# Patient Record
Sex: Female | Born: 1980 | Race: Black or African American | Hispanic: No | Marital: Single | Smoking: Never smoker
Health system: Southern US, Community
[De-identification: ages and names within clinical notes are randomized; demographics above are authoritative.]

## PROBLEM LIST (undated history)

## (undated) DIAGNOSIS — F419 Anxiety disorder, unspecified: Secondary | ICD-10-CM

## (undated) DIAGNOSIS — K219 Gastro-esophageal reflux disease without esophagitis: Secondary | ICD-10-CM

---

## 2020-03-05 ENCOUNTER — Encounter: Payer: Self-pay | Admitting: Emergency Medicine

## 2020-03-05 ENCOUNTER — Emergency Department: Payer: BC Managed Care – PPO

## 2020-03-05 ENCOUNTER — Emergency Department
Admission: EM | Admit: 2020-03-05 | Discharge: 2020-03-05 | Disposition: A | Discharge status: Home / Self Care | Payer: BC Managed Care – PPO | Attending: Emergency Medicine | Admitting: Emergency Medicine

## 2020-03-05 ENCOUNTER — Other Ambulatory Visit: Payer: Self-pay

## 2020-03-05 DIAGNOSIS — R0789 Other chest pain: Secondary | ICD-10-CM | POA: Insufficient documentation

## 2020-03-05 DIAGNOSIS — K219 Gastro-esophageal reflux disease without esophagitis: Secondary | ICD-10-CM | POA: Insufficient documentation

## 2020-03-05 DIAGNOSIS — F419 Anxiety disorder, unspecified: Secondary | ICD-10-CM | POA: Insufficient documentation

## 2020-03-05 HISTORY — DX: Gastro-esophageal reflux disease without esophagitis: K21.9

## 2020-03-05 HISTORY — DX: Anxiety disorder, unspecified: F41.9

## 2020-03-05 LAB — BASIC METABOLIC PANEL
Anion gap: 7 (ref 5–15)
BUN: 8 mg/dL (ref 6–20)
CO2: 24 mmol/L (ref 22–32)
Calcium: 8.9 mg/dL (ref 8.9–10.3)
Chloride: 106 mmol/L (ref 98–111)
Creatinine, Ser: 0.87 mg/dL (ref 0.44–1.00)
GFR calc Af Amer: >60 mL/min (ref >60)
GFR calc non Af Amer: >60 mL/min (ref >60)
Glucose, Bld: 111 mg/dL — ABNORMAL HIGH (ref 70–99)
Potassium: 3.8 mmol/L (ref 3.5–5.1)
Sodium: 137 mmol/L (ref 135–145)

## 2020-03-05 LAB — CBC
HCT: 30.2 % — ABNORMAL LOW (ref 36.0–46.0)
Hemoglobin: 9 g/dL — ABNORMAL LOW (ref 12.0–15.0)
MCH: 23.3 pg — ABNORMAL LOW (ref 26.0–34.0)
MCHC: 29.8 g/dL — ABNORMAL LOW (ref 30.0–36.0)
MCV: 78 fL — ABNORMAL LOW (ref 80.0–100.0)
Platelets: 496 10*3/uL — ABNORMAL HIGH (ref 150–400)
RBC: 3.87 MIL/uL (ref 3.87–5.11)
RDW: 18.5 % — ABNORMAL HIGH (ref 11.5–15.5)
WBC: 6.1 10*3/uL (ref 4.0–10.5)
nRBC: 0 % (ref 0.0–0.2)

## 2020-03-05 LAB — TROPONIN I (HIGH SENSITIVITY): Troponin I (High Sensitivity): 3 ng/L (ref <18)

## 2020-03-05 MED ORDER — ALUM & MAG HYDROXIDE-SIMETH 200-200-20 MG/5ML PO SUSP
30.0000 mL | Freq: Once | ORAL | Status: AC
  Administered 2020-03-05: 11:00:00 30 mL via ORAL
  Filled 2020-03-05: qty 30

## 2020-03-05 MED ORDER — LORAZEPAM 1 MG PO TABS
1.0000 mg | ORAL_TABLET | Freq: Once | ORAL | Status: AC
  Administered 2020-03-05: 11:00:00 1 mg via ORAL
  Filled 2020-03-05: qty 1

## 2020-03-05 MED ORDER — HYDROXYZINE HCL 25 MG PO TABS: 25.0000 mg | ORAL_TABLET | Freq: Three times a day (TID) | ORAL | 0 refills | Status: AC | PRN

## 2020-03-05 MED ORDER — FAMOTIDINE 20 MG PO TABS: 20.0000 mg | ORAL_TABLET | Freq: Two times a day (BID) | ORAL | 0 refills | Status: AC

## 2020-03-05 MED ORDER — FAMOTIDINE 20 MG PO TABS
40.0000 mg | ORAL_TABLET | Freq: Once | ORAL | Status: AC
  Administered 2020-03-05: 40 mg via ORAL
  Filled 2020-03-05: qty 2

## 2020-03-05 NOTE — ED Notes (Signed)
Pt from home with presumed anxiety that began yesterday. She reports that she has had issues with anxiety in the past (over a year ago), and that she has coping skills, but they do not seem to be working. She c/o tingling in her hands and a feeling of "claustrophobia." Pt alert & oriented; kept her face covered during most of assessment as she states she is sensitive to light. NAD noted.

## 2020-03-05 NOTE — ED Triage Notes (Signed)
Pt in via EMS from home with c/o anxiety for the last 24 hours. Hx of same but never this bad, no medications for it. 98.1 temp, 62HR, 100%RA, 144/82. C/o some SOB, lungs clear, fingers tingling and numb.

## 2020-03-05 NOTE — ED Triage Notes (Signed)
Pt here for what she thinks has been anxiety episodes over last 24 hours but normally can calm herself down on own.  Normally able to determine a trigger but no known current stressor.  Pt also c/o chest pain and tingling in both hands. VSS. NAD, unlabored.

## 2020-03-05 NOTE — ED Provider Notes (Signed)
[facility] Emergency Department Provider Note  ____________________________________________  Time seen: Approximately 10:17 AM  I have reviewed the triage vital signs and the nursing notes.   HISTORY  Chief Complaint Anxiety and Chest Pain    HPI Cheryl Hahn is a 39 y.o. female with a history of anxiety and GERD who comes the ED complaining of feeling anxious since yesterday.  She usually is able to manage anxiety episodes without medications by getting fresh air and going for short walks and taking some deep breaths.  However, due to high levels of pine pollen in the air currently she felt like she was unable to do this.  She tried putting up a fan in her window which did not help.  She had poor sleep last night.  Denies chest pain.  Denies cough fever or chills.  Endorses a feeling of shortness of breath associated with bilateral hand tingling.  Symptoms are constant without aggravating or alleviating factors.      Past Medical History:  Diagnosis Date  . Anxiety   . GERD (gastroesophageal reflux disease)      There are no problems to display for this patient.    Past Surgical History:  Procedure Laterality Date  . CESAREAN SECTION       Prior to Admission medications   Medication Sig Start Date End Date Taking? Authorizing Provider  famotidine (PEPCID) 20 MG tablet Take 1 tablet (20 mg total) by mouth 2 (two) times daily. 03/05/20   Carrie Mew, MD  hydrOXYzine (ATARAX/VISTARIL) 25 MG tablet Take 1 tablet (25 mg total) by mouth 3 (three) times daily as needed for anxiety. 03/05/20   Carrie Mew, MD     Allergies Patient has no known allergies.   History reviewed. No pertinent family history.  Social History Social History   Tobacco Use  . Smoking status: Never Smoker  . Smokeless tobacco: Never Used  Substance Use Topics  . Alcohol use: Yes  . Drug use: Never    Review of Systems  Constitutional:   No fever or  chills.  ENT:   No sore throat. No rhinorrhea. Cardiovascular:   No chest pain or syncope. Respiratory: Positive shortness of breath without cough. Gastrointestinal:   Negative for abdominal pain, vomiting and diarrhea.  Musculoskeletal:   Negative for focal pain or swelling All other systems reviewed and are negative except as documented above in ROS and HPI.  ____________________________________________   PHYSICAL EXAM:  VITAL SIGNS: ED Triage Vitals  Enc Vitals Group     BP 03/05/20 0741 122/69     Pulse Rate 03/05/20 0741 64     Resp 03/05/20 0741 18     Temp 03/05/20 0741 98.4 F (36.9 C)     Temp Source 03/05/20 0741 Oral     SpO2 03/05/20 0741 100 %     Weight 03/05/20 0742 240 lb (108.9 kg)     Height 03/05/20 0742 5\' 1"  (1.549 m)     Head Circumference --      Peak Flow --      Pain Score 03/05/20 0741 0     Pain Loc --      Pain Edu? --      Excl. in [place]? --     Vital signs reviewed, nursing assessments reviewed.   Constitutional:   Alert and oriented. Non-toxic appearance. Eyes:   Conjunctivae are normal. EOMI.  ENT      Head:   Normocephalic and atraumatic.  Nose:   Wearing a mask.      Mouth/Throat:   Wearing a mask.      Neck:   No meningismus. Full ROM. Cardiovascular:   RRR. Symmetric bilateral radial and DP pulses.  No murmurs. Cap refill less than 2 seconds. Respiratory:   Normal respiratory effort without tachypnea/retractions. Breath sounds are clear and equal bilaterally. No wheezes/rales/rhonchi. Musculoskeletal:   Normal range of motion in all extremities. No joint effusions.  No lower extremity tenderness.  No edema. Neurologic:   Normal speech and language.  Motor grossly intact. No acute focal neurologic deficits are appreciated.    ____________________________________________    LABS (pertinent positives/negatives) (all labs ordered are listed, but only abnormal results are displayed) Labs Reviewed  BASIC METABOLIC PANEL -  Abnormal; Notable for the following components:      Result Value   Glucose, Bld 111 (*)    All other components within normal limits  CBC - Abnormal; Notable for the following components:   Hemoglobin 9.0 (*)    HCT 30.2 (*)    MCV 78.0 (*)    MCH 23.3 (*)    MCHC 29.8 (*)    RDW 18.5 (*)    Platelets 496 (*)    All other components within normal limits  POC URINE PREG, ED  TROPONIN I (HIGH SENSITIVITY)  TROPONIN I (HIGH SENSITIVITY)   ____________________________________________   EKG  Interpreted by me Sinus rhythm rate of 60, normal axis and intervals.  Normal QRS ST segments and T waves.  No acute ischemic changes.  ____________________________________________    RADIOLOGY  DG Chest 2 View  Result Date: 03/05/2020 CLINICAL DATA:  Chest pain anxiety EXAM: CHEST - 2 VIEW COMPARISON:  None. FINDINGS: Normal mediastinum and cardiac silhouette. Normal pulmonary vasculature. No evidence of effusion, infiltrate, or pneumothorax. No acute bony abnormality. IMPRESSION: No acute cardiopulmonary process. Electronically Signed   By: Genevive Bi M.D.   On: 03/05/2020 08:38    ____________________________________________   PROCEDURES Procedures  ____________________________________________    CLINICAL IMPRESSION / ASSESSMENT AND PLAN / ED COURSE  Medications ordered in the ED: Medications  LORazepam (ATIVAN) tablet 1 mg (has no administration in time range)  alum & mag hydroxide-simeth (MAALOX/MYLANTA) 200-200-20 MG/5ML suspension 30 mL (has no administration in time range)  famotidine (PEPCID) tablet 40 mg (has no administration in time range)    Pertinent labs & imaging results that were available during my care of the patient were reviewed by me and considered in my medical decision making (see chart for details).  Cheryl Hahn was evaluated in Emergency Department on 03/05/2020 for the symptoms described in the history of present illness. She was evaluated in the  context of the global COVID-19 pandemic, which necessitated consideration that the patient might be at risk for infection with the SARS-CoV-2 virus that causes COVID-19. Institutional protocols and algorithms that pertain to the evaluation of patients at risk for COVID-19 are in a state of rapid change based on information released by regulatory bodies including the CDC and federal and state organizations. These policies and algorithms were followed during the patient's care in the ED.     Clinical Course as of Mar 05 1016  Thu Mar 05, 2020  1013 Patient presents with anxiousness, hand tingling bilaterally.  Vital signs are normal, labs are normal, chest x-ray is normal, EKG is normal and without evidence of right heart strain.  Considering all of this and atypical symptoms, I doubt ACS PE dissection pneumonia or  carditis.  I will give a dose of Ativan, Maalox and Pepcid, prescription for Vistaril, follow-up with PCP.   [PS]    Clinical Course User Index [PS] Sharman Cheek, MD     ____________________________________________   FINAL CLINICAL IMPRESSION(S) / ED DIAGNOSES    Final diagnoses:  Anxiety  Gastroesophageal reflux disease without esophagitis     ED Discharge Orders         Ordered    hydrOXYzine (ATARAX/VISTARIL) 25 MG tablet  3 times daily PRN     03/05/20 1017    famotidine (PEPCID) 20 MG tablet  2 times daily     03/05/20 1017          Portions of this note were generated with dragon dictation software. Dictation errors may occur despite best attempts at proofreading.   Sharman Cheek, MD 03/05/20 1019

## 2020-11-23 ENCOUNTER — Other Ambulatory Visit: Payer: Self-pay

## 2020-11-23 DIAGNOSIS — Z20822 Contact with and (suspected) exposure to covid-19: Secondary | ICD-10-CM | POA: Diagnosis not present

## 2020-11-23 DIAGNOSIS — R519 Headache, unspecified: Secondary | ICD-10-CM | POA: Diagnosis present

## 2020-11-23 DIAGNOSIS — B349 Viral infection, unspecified: Secondary | ICD-10-CM | POA: Insufficient documentation

## 2020-11-23 LAB — CBC
HCT: 33.1 % — ABNORMAL LOW (ref 36.0–46.0)
Hemoglobin: 10.4 g/dL — ABNORMAL LOW (ref 12.0–15.0)
MCH: 27.4 pg (ref 26.0–34.0)
MCHC: 31.4 g/dL (ref 30.0–36.0)
MCV: 87.3 fL (ref 80.0–100.0)
Platelets: 513 10*3/uL — ABNORMAL HIGH (ref 150–400)
RBC: 3.79 MIL/uL — ABNORMAL LOW (ref 3.87–5.11)
RDW: 14.4 % (ref 11.5–15.5)
WBC: 8.3 10*3/uL (ref 4.0–10.5)
nRBC: 0 % (ref 0.0–0.2)

## 2020-11-23 LAB — COMPREHENSIVE METABOLIC PANEL
ALT: 15 U/L (ref 0–44)
AST: 18 U/L (ref 15–41)
Albumin: 3.9 g/dL (ref 3.5–5.0)
Alkaline Phosphatase: 91 U/L (ref 38–126)
Anion gap: 7 (ref 5–15)
BUN: 9 mg/dL (ref 6–20)
CO2: 26 mmol/L (ref 22–32)
Calcium: 9.1 mg/dL (ref 8.9–10.3)
Chloride: 102 mmol/L (ref 98–111)
Creatinine, Ser: 0.86 mg/dL (ref 0.44–1.00)
GFR, Estimated: >60 mL/min (ref >60)
Glucose, Bld: 109 mg/dL — ABNORMAL HIGH (ref 70–99)
Potassium: 3.7 mmol/L (ref 3.5–5.1)
Sodium: 135 mmol/L (ref 135–145)
Total Bilirubin: 0.4 mg/dL (ref 0.3–1.2)
Total Protein: 8.1 g/dL (ref 6.5–8.1)

## 2020-11-23 LAB — POC URINE PREG, ED: Preg Test, Ur: NEGATIVE

## 2020-11-23 NOTE — ED Triage Notes (Signed)
Pt In with co headache that started yesterday, co chills and vomiting. States has vomited x 3 today.

## 2020-11-24 ENCOUNTER — Emergency Department
Admission: EM | Admit: 2020-11-24 | Discharge: 2020-11-24 | Disposition: A | Discharge status: Home / Self Care | Payer: BC Managed Care – PPO | Attending: Emergency Medicine | Admitting: Emergency Medicine

## 2020-11-24 DIAGNOSIS — B349 Viral infection, unspecified: Secondary | ICD-10-CM

## 2020-11-24 LAB — URINALYSIS, COMPLETE (UACMP) WITH MICROSCOPIC
Bacteria, UA: NONE SEEN
Bilirubin Urine: NEGATIVE
Glucose, UA: NEGATIVE mg/dL
Ketones, ur: NEGATIVE mg/dL
Leukocytes,Ua: NEGATIVE
Nitrite: NEGATIVE
Protein, ur: NEGATIVE mg/dL
Specific Gravity, Urine: 1.004 — ABNORMAL LOW (ref 1.005–1.030)
WBC, UA: NONE SEEN WBC/hpf (ref 0–5)
pH: 5 (ref 5.0–8.0)

## 2020-11-24 LAB — RESP PANEL BY RT-PCR (FLU A&B, COVID) ARPGX2
Influenza A by PCR: NEGATIVE
Influenza B by PCR: NEGATIVE
SARS Coronavirus 2 by RT PCR: NEGATIVE

## 2020-11-24 LAB — HCG, QUANTITATIVE, PREGNANCY: hCG, Beta Chain, Quant, S: <1 m[IU]/mL (ref <5)

## 2020-11-24 LAB — POC URINE PREG, ED: Preg Test, Ur: NEGATIVE

## 2020-11-24 MED ORDER — METOCLOPRAMIDE HCL 5 MG/ML IJ SOLN
10.0000 mg | Freq: Once | INTRAMUSCULAR | Status: AC
  Administered 2020-11-24: 10 mg via INTRAVENOUS
  Filled 2020-11-24: qty 2

## 2020-11-24 MED ORDER — KETOROLAC TROMETHAMINE 30 MG/ML IJ SOLN
15.0000 mg | Freq: Once | INTRAMUSCULAR | Status: AC
  Administered 2020-11-24: 15 mg via INTRAVENOUS
  Filled 2020-11-24: qty 1

## 2020-11-24 MED ORDER — ONDANSETRON HCL 4 MG/2ML IJ SOLN
4.0000 mg | Freq: Once | INTRAMUSCULAR | Status: AC
  Administered 2020-11-24: 4 mg via INTRAVENOUS
  Filled 2020-11-24: qty 2

## 2020-11-24 MED ORDER — LACTATED RINGERS IV BOLUS
1000.0000 mL | Freq: Once | INTRAVENOUS | Status: AC
  Administered 2020-11-24: 1000 mL via INTRAVENOUS

## 2020-11-24 MED ORDER — ONDANSETRON 4 MG PO TBDP: 4.0000 mg | ORAL_TABLET | Freq: Three times a day (TID) | ORAL | 0 refills | Status: AC | PRN

## 2020-11-24 NOTE — ED Provider Notes (Addendum)
[facility] Emergency Department Provider Note  ____________________________________________  Time seen: Approximately 2:22 AM  I have reviewed the triage vital signs and the nursing notes.   HISTORY  Chief Complaint Headache   HPI Cheryl Hahn is a 39 y.o. female with a history of anxiety and GERD who presents for evaluation of headache, nausea and vomiting.  Patient reports that her symptoms started 5 days ago.  She has had several episodes of nonbloody nonbilious emesis.  Today she started having a headache that she describes as throbbing, frontal headache.  No fever but she reports that she feels like her body is burning inside.  No diarrhea, no chest pain, no shortness of breath, no cough, no abdominal pain.  She reports being extremely fatigued and having chills.  She denies any neck stiffness, rash, no known exposures to Covid or flu.  Past Medical History:  Diagnosis Date  . Anxiety   . GERD (gastroesophageal reflux disease)     Past Surgical History:  Procedure Laterality Date  . CESAREAN SECTION      Prior to Admission medications   Medication Sig Start Date End Date Taking? Authorizing Provider  ondansetron (ZOFRAN ODT) 4 MG disintegrating tablet Take 1 tablet (4 mg total) by mouth every 8 (eight) hours as needed. 11/24/20  Yes Don Perking, [place], MD  famotidine (PEPCID) 20 MG tablet Take 1 tablet (20 mg total) by mouth 2 (two) times daily. 03/05/20   Sharman Cheek, MD  hydrOXYzine (ATARAX/VISTARIL) 25 MG tablet Take 1 tablet (25 mg total) by mouth 3 (three) times daily as needed for anxiety. 03/05/20   Sharman Cheek, MD    Allergies Patient has no known allergies.  No family history on file.  Social History Social History   Tobacco Use  . Smoking status: Never Smoker  . Smokeless tobacco: Never Used  Substance Use Topics  . Alcohol use: Yes  . Drug use: Never    Review of Systems  Constitutional: Negative for fever. +  chills and fatigue Eyes: Negative for visual changes. ENT: Negative for sore throat. Neck: No neck pain  Cardiovascular: Negative for chest pain. Respiratory: Negative for shortness of breath. Gastrointestinal: Negative for abdominal pain or diarrhea. + N/V Genitourinary: Negative for dysuria. Musculoskeletal: Negative for back pain. Skin: Negative for rash. Neurological: Negative for  weakness or numbness. + HA Psych: No SI or HI  ____________________________________________   PHYSICAL EXAM:  VITAL SIGNS: ED Triage Vitals  Enc Vitals Group     BP 11/23/20 2315 126/69     Pulse Rate 11/23/20 2315 69     Resp 11/23/20 2315 20     Temp 11/23/20 2315 98.6 F (37 C)     Temp Source 11/23/20 2315 Oral     SpO2 11/23/20 2315 100 %     Weight 11/23/20 2316 245 lb (111.1 kg)     Height 11/23/20 2316 5\' 1"  (1.549 m)     Head Circumference --      Peak Flow --      Pain Score 11/23/20 2316 8     Pain Loc --      Pain Edu? --      Excl. in [place]? --     Constitutional: Alert and oriented. Well appearing and in no apparent distress. HEENT:      Head: Normocephalic and atraumatic.         Eyes: Conjunctivae are normal. Sclera is non-icteric.       Mouth/Throat: Mucous membranes are  moist.       Neck: Supple with no signs of meningismus. Cardiovascular: Regular rate and rhythm. No murmurs, gallops, or rubs. 2+ symmetrical distal pulses are present in all extremities. No JVD. Respiratory: Normal respiratory effort. Lungs are clear to auscultation bilaterally. No wheezes, crackles, or rhonchi.  Gastrointestinal: Soft, non tender, and non distended. Musculoskeletal: No edema, cyanosis, or erythema of extremities. Neurologic: Normal speech and language. Face is symmetric. Moving all extremities. No gross focal neurologic deficits are appreciated. Skin: Skin is warm, dry and intact. No rash noted. Psychiatric: Mood and affect are normal. Speech and behavior are  normal.  ____________________________________________   LABS (all labs ordered are listed, but only abnormal results are displayed)  Labs Reviewed  CBC - Abnormal; Notable for the following components:      Result Value   RBC 3.79 (*)    Hemoglobin 10.4 (*)    HCT 33.1 (*)    Platelets 513 (*)    All other components within normal limits  COMPREHENSIVE METABOLIC PANEL - Abnormal; Notable for the following components:   Glucose, Bld 109 (*)    All other components within normal limits  URINALYSIS, COMPLETE (UACMP) WITH MICROSCOPIC - Abnormal; Notable for the following components:   Color, Urine STRAW (*)    APPearance CLEAR (*)    Specific Gravity, Urine 1.004 (*)    Hgb urine dipstick MODERATE (*)    All other components within normal limits  RESP PANEL BY RT-PCR (FLU A&B, COVID) ARPGX2  HCG, QUANTITATIVE, PREGNANCY  POC URINE PREG, ED  POC URINE PREG, ED   ____________________________________________  EKG  none  ____________________________________________  RADIOLOGY  none  ____________________________________________   PROCEDURES  Procedure(s) performed:yes .1-3 Lead EKG Interpretation Performed by: Nita Sickle, MD Authorized by: Nita Sickle, MD     Interpretation: normal     ECG rate assessment: normal     Rhythm: sinus rhythm     Ectopy: none     Critical Care performed:  None ____________________________________________   INITIAL IMPRESSION / ASSESSMENT AND PLAN / ED COURSE  39 y.o. female with a history of anxiety and GERD who presents for evaluation of nausea, vomiting, chills and fatigue x 5 days and today with HA.  Patient is extremely well-appearing in no distress with normal vital signs, normal work of breathing, normal sats, lungs are clear to auscultation bilaterally, abdomen is soft with no tenderness throughout.  Covid and flu negative.  Patient has no respiratory symptoms, no fever, no leukocytosis.  Stable mild anemia.   Metabolic panel with no significant abnormalities.  UA with no evidence of infection.  Patient reports feeling similar when she was pregnant before. Negative Upreg, will do a hCG. No thunderclap HA or neck stiffness. No neuro deficits.   ddx viral illness, dehydration, pregnancy. Old medical records reviewed. Will treat symptoms with toradol, reglan, and IVF. No abd pain or tenderness     _________________________ 4:37 AM on 11/24/2020 -----------------------------------------  Patient reports resolution of nausea, vomiting and headache.  She is tolerating p.o.  Pregnancy test is negative.  Discussed increase oral hydration, bland diet, Zofran for nausea, symptomatic care with ibuprofen and Tylenol for headache and body aches.  Discussed follow-up with PCP and my standard return precautions.  _________________________ 4:57 AM on 11/24/2020 ----------------------------------------- Patient vomited again. Will redose antiemetic and give another bolus.  _________________________ 6:57 AM on 11/24/2020 -----------------------------------------  Patient able to keep fluids down but vomits when eats solids. Recommended admission for failing PO challenge but  patient wishes to go home. She will try liquid diet.  Zofran sent to her pharmacy.  She does have a primary care doctor and she will follow-up with her in the next day or 2.  Recommended return to the emergency room.  She remains extremely well-appearing and full resolution of her headache.  Denies any abdominal pain, abdomen remains soft with no tenderness throughout.  Most likely a viral illness.   _____________________________________________ Please note:  Patient was evaluated in Emergency Department today for the symptoms described in the history of present illness. Patient was evaluated in the context of the global COVID-19 pandemic, which necessitated consideration that the patient might be at risk for infection with the SARS-CoV-2 virus  that causes COVID-19. Institutional protocols and algorithms that pertain to the evaluation of patients at risk for COVID-19 are in a state of rapid change based on information released by regulatory bodies including the CDC and federal and state organizations. These policies and algorithms were followed during the patient's care in the ED.  Some ED evaluations and interventions may be delayed as a result of limited staffing during the pandemic.   [place] Controlled Substance Database was reviewed by me. ____________________________________________   FINAL CLINICAL IMPRESSION(S) / ED DIAGNOSES   Final diagnoses:  Viral illness      NEW MEDICATIONS STARTED DURING THIS VISIT:  ED Discharge Orders         Ordered    ondansetron (ZOFRAN ODT) 4 MG disintegrating tablet  Every 8 hours PRN        11/24/20 0436           Note:  This document was prepared using Dragon voice recognition software and may include unintentional dictation errors.    Don Perking, [place], MD 11/24/20 9767    Nita Sickle, MD 11/24/20 3419    Nita Sickle, MD 11/24/20 [phone]

## 2020-11-24 NOTE — Discharge Instructions (Signed)
Follow-up with your doctor in 2 days.  Take Zofran as needed for nausea.  Tylenol or ibuprofen for headaches.  Try a bland diet for the next week until your vomiting has subsided.  Return to the emergency room for chest pain, shortness of breath, or abdominal pain.

## 2020-11-24 NOTE — ED Notes (Addendum)
Pt failed PO challenge x2. Dr. Don Perking aware.

## 2020-11-24 NOTE — ED Notes (Signed)
Pt provided with crackers and water to attempt PO challenge again.

## 2020-11-24 NOTE — ED Notes (Addendum)
Pt provided with crackers and water for PO challenge.  

## 2021-03-26 IMAGING — CR DG CHEST 2V
1 series · 3 of 3 positions shown · non-contrast
Comparison: None.

CLINICAL DATA: Chest pain anxiety

EXAM:
CHEST - 2 VIEW

[Series 1: dg chest 2 view · 0.14mm/px · 3 of 3 slices shown]
[im 1/3]
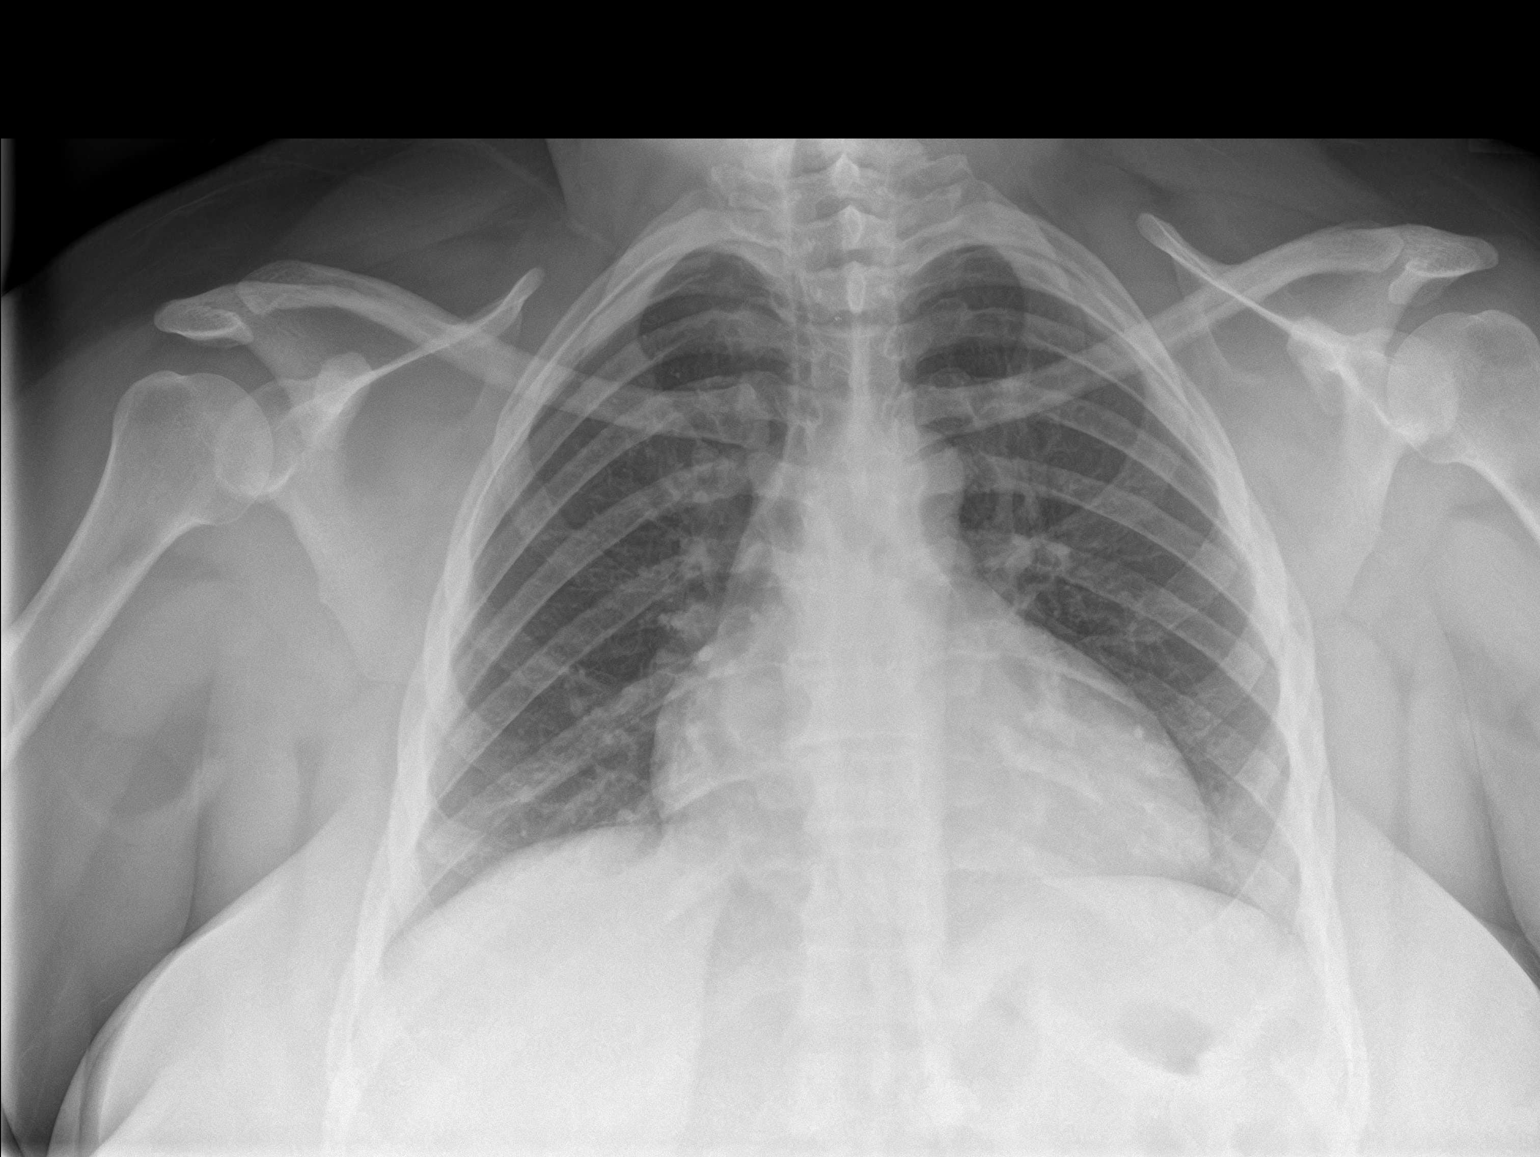
[im 2/3]
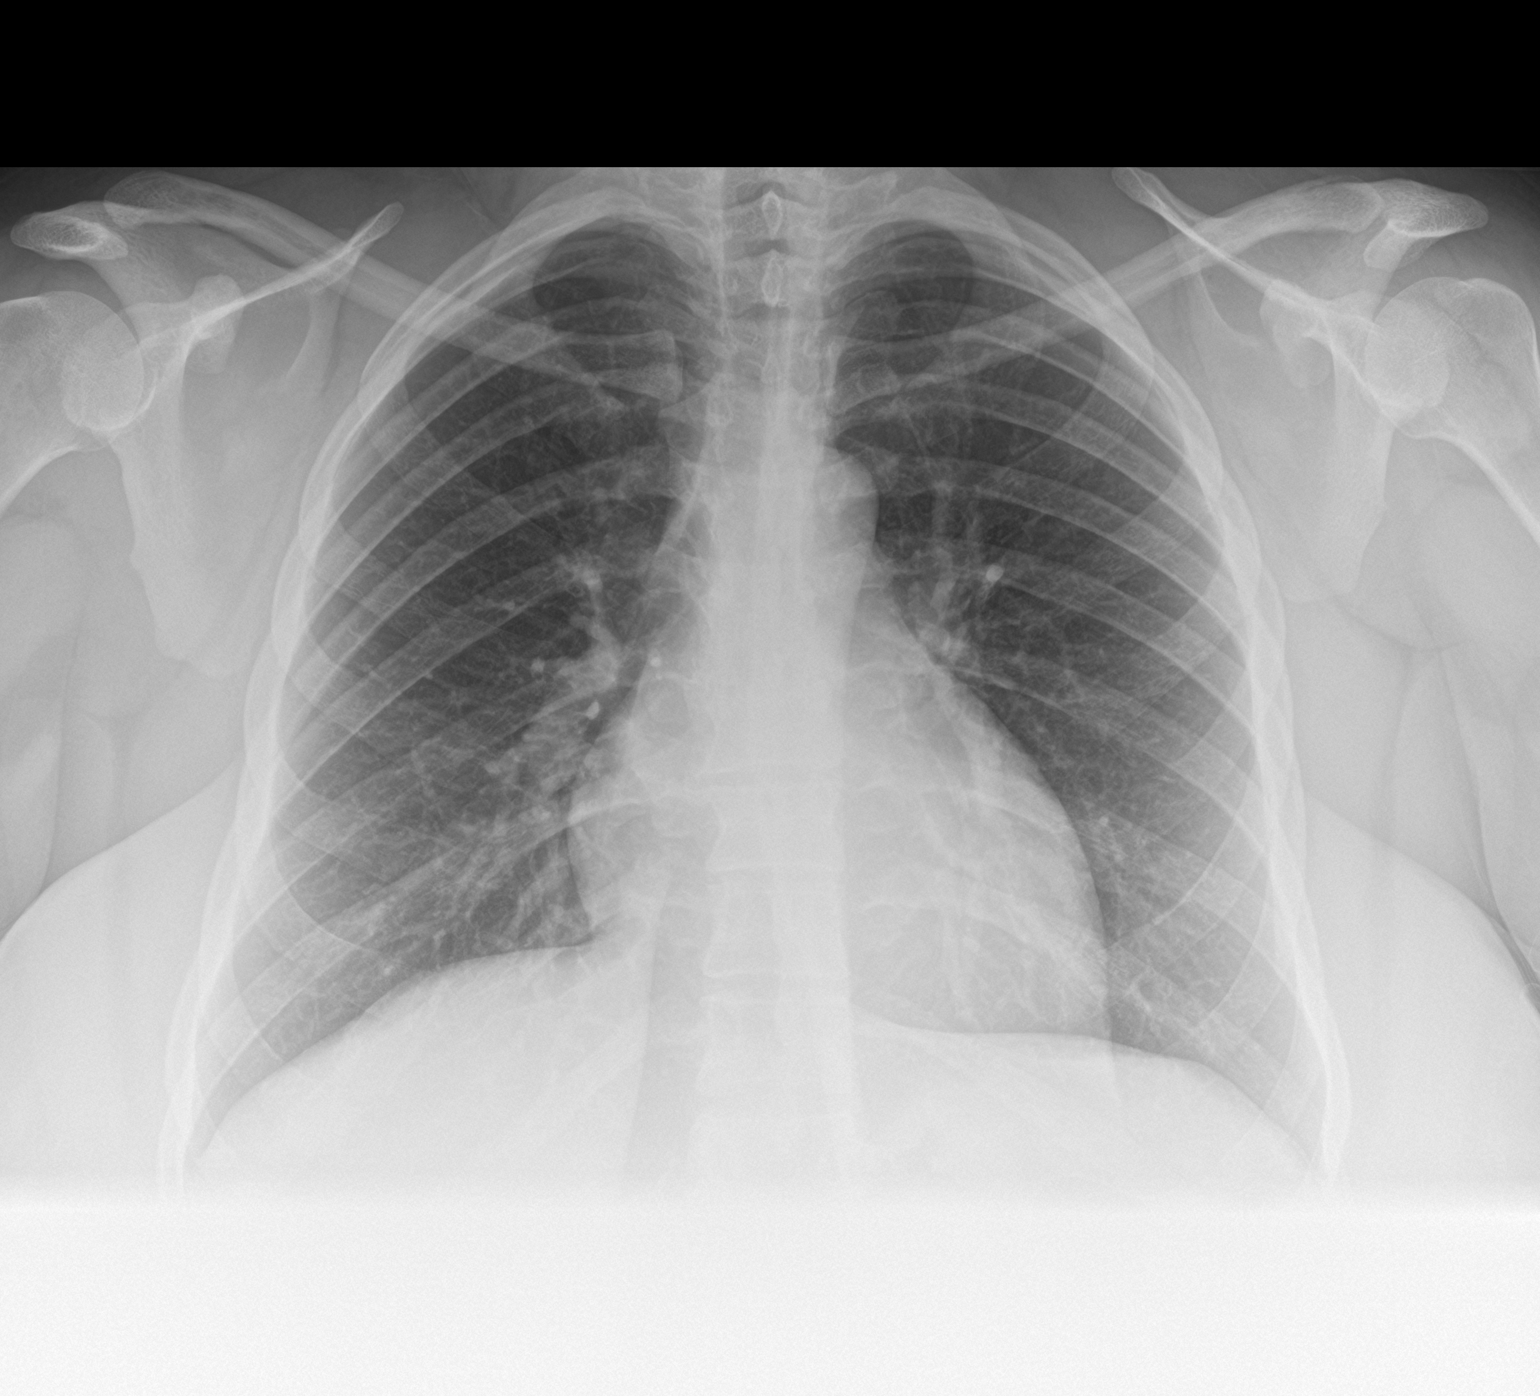
[im 3/3]
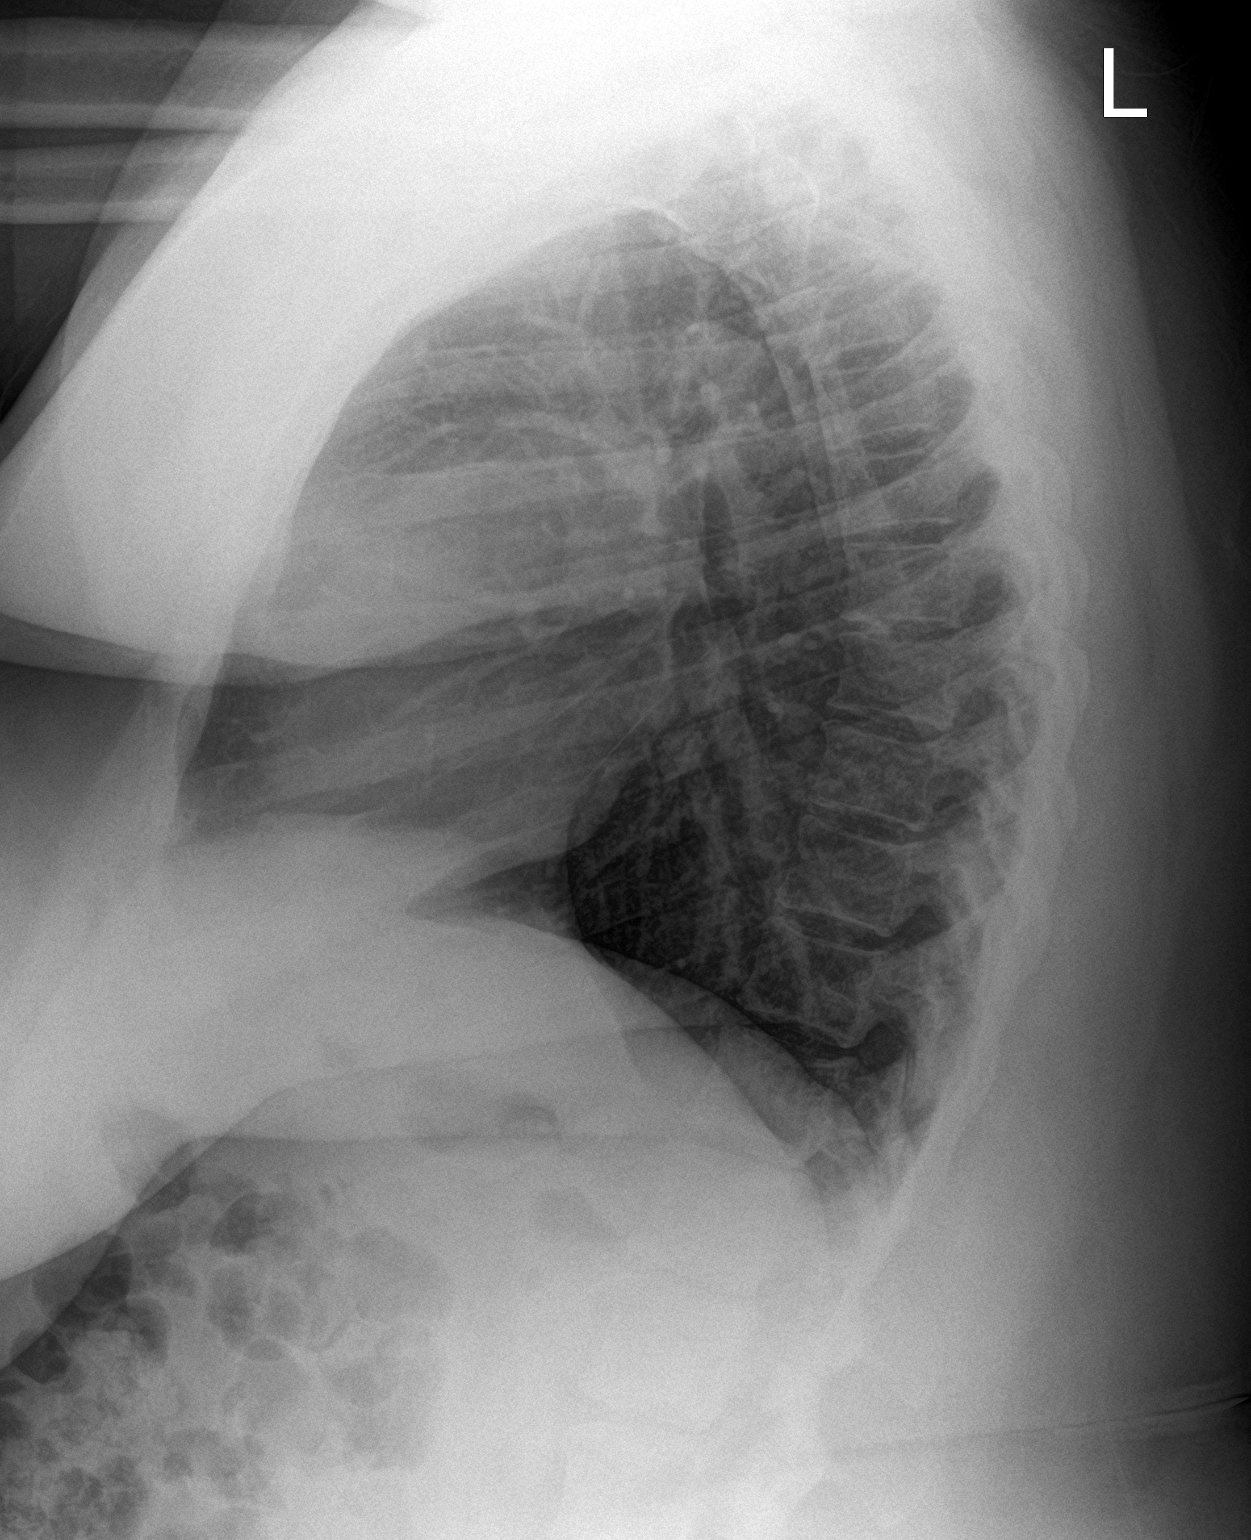

[3 of 3 positions shown; findings below may reference images not displayed]

FINDINGS: Normal mediastinum and cardiac silhouette. Normal pulmonary
vasculature. No evidence of effusion, infiltrate, or pneumothorax.
No acute bony abnormality.
IMPRESSION: No acute cardiopulmonary process.

## 2024-03-17 ENCOUNTER — Emergency Department

## 2024-03-17 ENCOUNTER — Other Ambulatory Visit: Payer: Self-pay

## 2024-03-17 ENCOUNTER — Emergency Department
Admission: EM | Admit: 2024-03-17 | Discharge: 2024-03-17 | Disposition: A | Discharge status: Home / Self Care | Attending: Emergency Medicine | Admitting: Emergency Medicine

## 2024-03-17 DIAGNOSIS — R1031 Right lower quadrant pain: Secondary | ICD-10-CM | POA: Diagnosis present

## 2024-03-17 DIAGNOSIS — R109 Unspecified abdominal pain: Secondary | ICD-10-CM

## 2024-03-17 DIAGNOSIS — D259 Leiomyoma of uterus, unspecified: Secondary | ICD-10-CM | POA: Diagnosis not present

## 2024-03-17 DIAGNOSIS — D219 Benign neoplasm of connective and other soft tissue, unspecified: Secondary | ICD-10-CM

## 2024-03-17 LAB — CBC
HCT: 34.3 % — ABNORMAL LOW (ref 36.0–46.0)
Hemoglobin: 11.1 g/dL — ABNORMAL LOW (ref 12.0–15.0)
MCH: 29.2 pg (ref 26.0–34.0)
MCHC: 32.4 g/dL (ref 30.0–36.0)
MCV: 90.3 fL (ref 80.0–100.0)
Platelets: 445 10*3/uL — ABNORMAL HIGH (ref 150–400)
RBC: 3.8 MIL/uL — ABNORMAL LOW (ref 3.87–5.11)
RDW: 14.6 % (ref 11.5–15.5)
WBC: 8 10*3/uL (ref 4.0–10.5)
nRBC: 0 % (ref 0.0–0.2)

## 2024-03-17 LAB — URINALYSIS, ROUTINE W REFLEX MICROSCOPIC
Bilirubin Urine: NEGATIVE
Glucose, UA: NEGATIVE mg/dL
Hgb urine dipstick: NEGATIVE
Ketones, ur: NEGATIVE mg/dL
Nitrite: NEGATIVE
Protein, ur: NEGATIVE mg/dL
Specific Gravity, Urine: 1.024 (ref 1.005–1.030)
pH: 5 (ref 5.0–8.0)

## 2024-03-17 LAB — COMPREHENSIVE METABOLIC PANEL WITH GFR
ALT: 20 U/L (ref 0–44)
AST: 21 U/L (ref 15–41)
Albumin: 3.7 g/dL (ref 3.5–5.0)
Alkaline Phosphatase: 77 U/L (ref 38–126)
Anion gap: 6 (ref 5–15)
BUN: 12 mg/dL (ref 6–20)
CO2: 25 mmol/L (ref 22–32)
Calcium: 9.1 mg/dL (ref 8.9–10.3)
Chloride: 104 mmol/L (ref 98–111)
Creatinine, Ser: 0.96 mg/dL (ref 0.44–1.00)
GFR, Estimated: >60 mL/min (ref >60)
Glucose, Bld: 98 mg/dL (ref 70–99)
Potassium: 3.8 mmol/L (ref 3.5–5.1)
Sodium: 135 mmol/L (ref 135–145)
Total Bilirubin: 0.4 mg/dL (ref 0.0–1.2)
Total Protein: 7.5 g/dL (ref 6.5–8.1)

## 2024-03-17 LAB — POC URINE PREG, ED: Preg Test, Ur: NEGATIVE

## 2024-03-17 LAB — LIPASE, BLOOD: Lipase: 43 U/L (ref 11–51)

## 2024-03-17 MED ORDER — IOHEXOL 300 MG/ML  SOLN
100.0000 mL | Freq: Once | INTRAMUSCULAR | Status: AC | PRN
  Administered 2024-03-17: 100 mL via INTRAVENOUS

## 2024-03-17 MED ORDER — SODIUM CHLORIDE 0.9 % IV BOLUS
1000.0000 mL | Freq: Once | INTRAVENOUS | Status: AC
  Administered 2024-03-17: 1000 mL via INTRAVENOUS

## 2024-03-17 MED ORDER — KETOROLAC TROMETHAMINE 15 MG/ML IJ SOLN
15.0000 mg | Freq: Once | INTRAMUSCULAR | Status: AC
Start: 2024-03-17 — End: 2024-03-17
  Administered 2024-03-17: 15 mg via INTRAVENOUS
  Filled 2024-03-17: qty 1

## 2024-03-17 MED ORDER — ONDANSETRON 4 MG PO TBDP
4.0000 mg | ORAL_TABLET | Freq: Three times a day (TID) | ORAL | 0 refills | Status: AC | PRN
Start: 2024-03-17 — End: ?

## 2024-03-17 MED ORDER — ONDANSETRON HCL 4 MG/2ML IJ SOLN
4.0000 mg | Freq: Once | INTRAMUSCULAR | Status: AC
  Administered 2024-03-17: 4 mg via INTRAVENOUS
  Filled 2024-03-17: qty 2

## 2024-03-17 MED ORDER — ACETAMINOPHEN 500 MG PO TABS
1000.0000 mg | ORAL_TABLET | Freq: Once | ORAL | Status: AC
  Administered 2024-03-17: 1000 mg via ORAL
  Filled 2024-03-17: qty 2

## 2024-03-17 MED ORDER — MORPHINE SULFATE (PF) 4 MG/ML IV SOLN
4.0000 mg | Freq: Once | INTRAVENOUS | Status: AC
  Administered 2024-03-17: 4 mg via INTRAVENOUS
  Filled 2024-03-17: qty 1

## 2024-03-17 NOTE — ED Triage Notes (Signed)
 Pt from home via pov for R lower abdominal, inguinal, and leg pain.  Pt states the pain woke her from her sleep.  Pt states she feels warm.  Pt reports /V.  Denies SOB, CP.

## 2024-03-17 NOTE — Discharge Instructions (Signed)
 Fortunately your testing in the Emergency Department did not show any emergency conditions that accounted for your symptoms.  You do have fibroids in your uterus for which you should follow-up with her gynecologist for further evaluation and treatment options.  Take acetaminophen  650 mg and ibuprofen 400 mg every 6 hours for pain.  Take with food.  Thank you for choosing us  for your health care today!  Please see your primary doctor this week for a follow up appointment.   If you have any new, worsening, or unexpected symptoms call your doctor right away or come back to the emergency department for reevaluation.  It was my pleasure to care for you today.   Arron Large Margery Sheets, MD

## 2024-03-17 NOTE — ED Provider Notes (Signed)
 [facility] Provider Note    Event Date/Time   First MD Initiated Contact with Patient 03/17/24 [phone]     (approximate)   History   Abdominal Pain   HPI  Cheryl Hahn is a 43 y.o. female   Past medical history of no significant past medical history presents Emergency Department with right lower quadrant pain.  It woke her from sleep this morning.  Was nauseous earlier today but with no pain.  No fevers or chills.  No dysuria or frequency.  No vaginal discharge.  Bowel movements normal and denies diarrhea, denies GI bleeding.  She had a C-section but no other surgical history.   External Medical Documents Reviewed: Office visit from earlier this month with STD testing      Physical Exam   Triage Vital Signs: ED Triage Vitals  Encounter Vitals Group     BP 03/17/24 0341 138/89     Systolic BP Percentile --      Diastolic BP Percentile --      Pulse Rate 03/17/24 0341 74     Resp 03/17/24 0341 18     Temp 03/17/24 0341 98.7 F (37.1 C)     Temp Source 03/17/24 0341 Oral     SpO2 03/17/24 0341 100 %     Weight 03/17/24 0343 290 lb (131.5 kg)     Height 03/17/24 0343 5\' 1"  (1.549 m)     Head Circumference --      Peak Flow --      Pain Score 03/17/24 0342 8     Pain Loc --      Pain Education --      Exclude from Growth Chart --     Most recent vital signs: Vitals:   03/17/24 0341  BP: 138/89  Pulse: 74  Resp: 18  Temp: 98.7 F (37.1 C)  SpO2: 100%    General: Awake, no distress.  CV:  Good peripheral perfusion.  Resp:  Normal effort. Abd:  No distention.  Other:  Right lower and right upper quadrant tenderness to palpation without rigidity or guarding.  Vital signs normal afebrile.   ED Results / Procedures / Treatments   Labs (all labs ordered are listed, but only abnormal results are displayed) Labs Reviewed  CBC - Abnormal; Notable for the following components:      Result Value   RBC 3.80 (*)    Hemoglobin 11.1 (*)     HCT 34.3 (*)    Platelets 445 (*)    All other components within normal limits  URINALYSIS, ROUTINE W REFLEX MICROSCOPIC - Abnormal; Notable for the following components:   Color, Urine YELLOW (*)    APPearance CLEAR (*)    Leukocytes,Ua SMALL (*)    Bacteria, UA RARE (*)    All other components within normal limits  LIPASE, BLOOD  COMPREHENSIVE METABOLIC PANEL WITH GFR  POC URINE PREG, ED     I ordered and reviewed the above labs they are notable for white blood cell count normal    RADIOLOGY I independently reviewed and interpreted the of the abdomen see no obvious obstructive or inflammatory changes I also reviewed radiologist's formal read.   PROCEDURES:  Critical Care performed: No  Procedures   MEDICATIONS ORDERED IN ED: Medications  ondansetron  (ZOFRAN ) injection 4 mg (4 mg Intravenous Given 03/17/24 0449)  sodium chloride  0.9 % bolus 1,000 mL (1,000 mLs Intravenous New Bag/Given 03/17/24 0448)  morphine  (PF) 4 MG/ML injection 4 mg (4  mg Intravenous Given 03/17/24 0449)  ketorolac  (TORADOL ) 15 MG/ML injection 15 mg (15 mg Intravenous Given 03/17/24 0449)  acetaminophen  (TYLENOL ) tablet 1,000 mg (1,000 mg Oral Given 03/17/24 0449)  iohexol  (OMNIPAQUE ) 300 MG/ML solution 100 mL (100 mLs Intravenous Contrast Given 03/17/24 0526)     IMPRESSION / MDM / ASSESSMENT AND PLAN / ED COURSE  I reviewed the triage vital signs and the nursing notes.                                Patient's presentation is most consistent with acute presentation with potential threat to life or bodily function.  Differential diagnosis includes, but is not limited to, appendicitis, ovarian torsion, urinary tract infection, cholecystitis or biliary colic, functional abdominal pain   The patient is on the cardiac monitor to evaluate for evidence of arrhythmia and/or significant heart rate changes.  MDM:    She is tender to the right lower quadrant and right upper quadrant I am concerned  for surgical abdominal pathology so obtain a CT of her abdomen pelvis.  Considered ovarian pathologies as well, pregnancy related but she is not pregnant, no evidence of UTI on urinalysis without urinary symptoms will defer treatment for UTI.  Consider torsion but I think this is less likely, will pursue if there is no other pathologies identified on CT imaging or if there is ovarian enlargement .   CT with no significant findings, fibroids in the uterus, patient stable to be discharged with close PMD follow-up.  No ovarian enlargement so highly unlikely to be ovarian torsion.  Will follow-up with gynecology about her fibroids.     FINAL CLINICAL IMPRESSION(S) / ED DIAGNOSES   Final diagnoses:  Right sided abdominal pain  Fibroids     Rx / DC Orders   ED Discharge Orders          Ordered    ondansetron  (ZOFRAN -ODT) 4 MG disintegrating tablet  Every 8 hours PRN        03/17/24 0508             Note:  This document was prepared using Dragon voice recognition software and may include unintentional dictation errors.    Buell Carmin, MD 03/17/24 [phone]
